# Patient Record
Sex: Male | Born: 1975 | Race: Black or African American | Hispanic: No | Marital: Single | Smoking: Never smoker
Health system: Southern US, Community
[De-identification: ages and names within clinical notes are randomized; demographics above are authoritative.]

## PROBLEM LIST (undated history)

## (undated) DIAGNOSIS — R011 Cardiac murmur, unspecified: Secondary | ICD-10-CM

---

## 2005-10-16 ENCOUNTER — Emergency Department: Payer: Self-pay | Admitting: General Practice

## 2005-10-16 ENCOUNTER — Emergency Department: Payer: Self-pay | Admitting: Emergency Medicine

## 2005-12-02 ENCOUNTER — Emergency Department: Payer: Self-pay | Admitting: General Practice

## 2005-12-02 ENCOUNTER — Other Ambulatory Visit: Payer: Self-pay

## 2005-12-04 ENCOUNTER — Emergency Department: Payer: Self-pay | Admitting: Emergency Medicine

## 2005-12-05 ENCOUNTER — Other Ambulatory Visit: Payer: Self-pay

## 2005-12-06 ENCOUNTER — Emergency Department: Payer: Self-pay | Admitting: Emergency Medicine

## 2005-12-07 ENCOUNTER — Other Ambulatory Visit: Payer: Self-pay

## 2006-08-14 ENCOUNTER — Emergency Department: Payer: Self-pay | Admitting: Unknown Physician Specialty

## 2006-10-03 ENCOUNTER — Emergency Department: Payer: Self-pay | Admitting: Emergency Medicine

## 2006-12-15 ENCOUNTER — Emergency Department (HOSPITAL_COMMUNITY): Admission: EM | Admit: 2006-12-15 | Discharge: 2006-12-15 | Payer: Self-pay | Admitting: Emergency Medicine

## 2007-03-29 ENCOUNTER — Emergency Department: Payer: Self-pay

## 2007-08-15 ENCOUNTER — Emergency Department: Payer: Self-pay | Admitting: Emergency Medicine

## 2007-08-24 ENCOUNTER — Emergency Department: Payer: Self-pay | Admitting: Emergency Medicine

## 2007-08-30 ENCOUNTER — Ambulatory Visit: Payer: Self-pay | Admitting: Gastroenterology

## 2007-09-10 ENCOUNTER — Emergency Department: Payer: Self-pay | Admitting: Emergency Medicine

## 2007-09-14 ENCOUNTER — Emergency Department: Payer: Self-pay | Admitting: Internal Medicine

## 2007-10-24 ENCOUNTER — Emergency Department: Payer: Self-pay | Admitting: Emergency Medicine

## 2007-10-24 ENCOUNTER — Emergency Department (HOSPITAL_COMMUNITY): Admission: EM | Admit: 2007-10-24 | Discharge: 2007-10-24 | Payer: Self-pay | Admitting: Emergency Medicine

## 2009-04-27 ENCOUNTER — Emergency Department (HOSPITAL_COMMUNITY): Admission: EM | Admit: 2009-04-27 | Discharge: 2009-04-27 | Payer: Self-pay | Admitting: Emergency Medicine

## 2009-10-26 ENCOUNTER — Emergency Department: Payer: Self-pay | Admitting: Emergency Medicine

## 2009-11-17 ENCOUNTER — Emergency Department (HOSPITAL_COMMUNITY)
Admission: EM | Admit: 2009-11-17 | Discharge: 2009-11-17 | Payer: Self-pay | Source: Home / Self Care | Admitting: Emergency Medicine

## 2009-11-19 ENCOUNTER — Emergency Department: Payer: Self-pay | Admitting: Emergency Medicine

## 2009-11-27 ENCOUNTER — Emergency Department: Payer: Self-pay | Admitting: Emergency Medicine

## 2010-04-09 ENCOUNTER — Emergency Department (HOSPITAL_COMMUNITY): Admission: EM | Admit: 2010-04-09 | Discharge: 2010-04-09 | Payer: Self-pay | Admitting: Emergency Medicine

## 2010-05-20 ENCOUNTER — Emergency Department: Payer: Self-pay | Admitting: Emergency Medicine

## 2011-05-17 ENCOUNTER — Emergency Department: Payer: Self-pay | Admitting: Emergency Medicine

## 2012-08-29 ENCOUNTER — Emergency Department: Payer: Self-pay | Admitting: Emergency Medicine

## 2012-10-25 ENCOUNTER — Emergency Department: Payer: Self-pay | Admitting: Emergency Medicine

## 2012-10-25 LAB — RAPID INFLUENZA A&B ANTIGENS

## 2014-01-21 ENCOUNTER — Emergency Department: Payer: Self-pay | Admitting: Emergency Medicine

## 2014-09-09 IMAGING — CR DG CHEST 2V
1 series · 2 of 2 positions shown · non-contrast
Comparison: none

REASON FOR EXAM: cough, chest pain
COMMENTS:

[Series 1: w chest pa · 0.14mm/px · 2 of 2 slices shown]
[im 1/2]
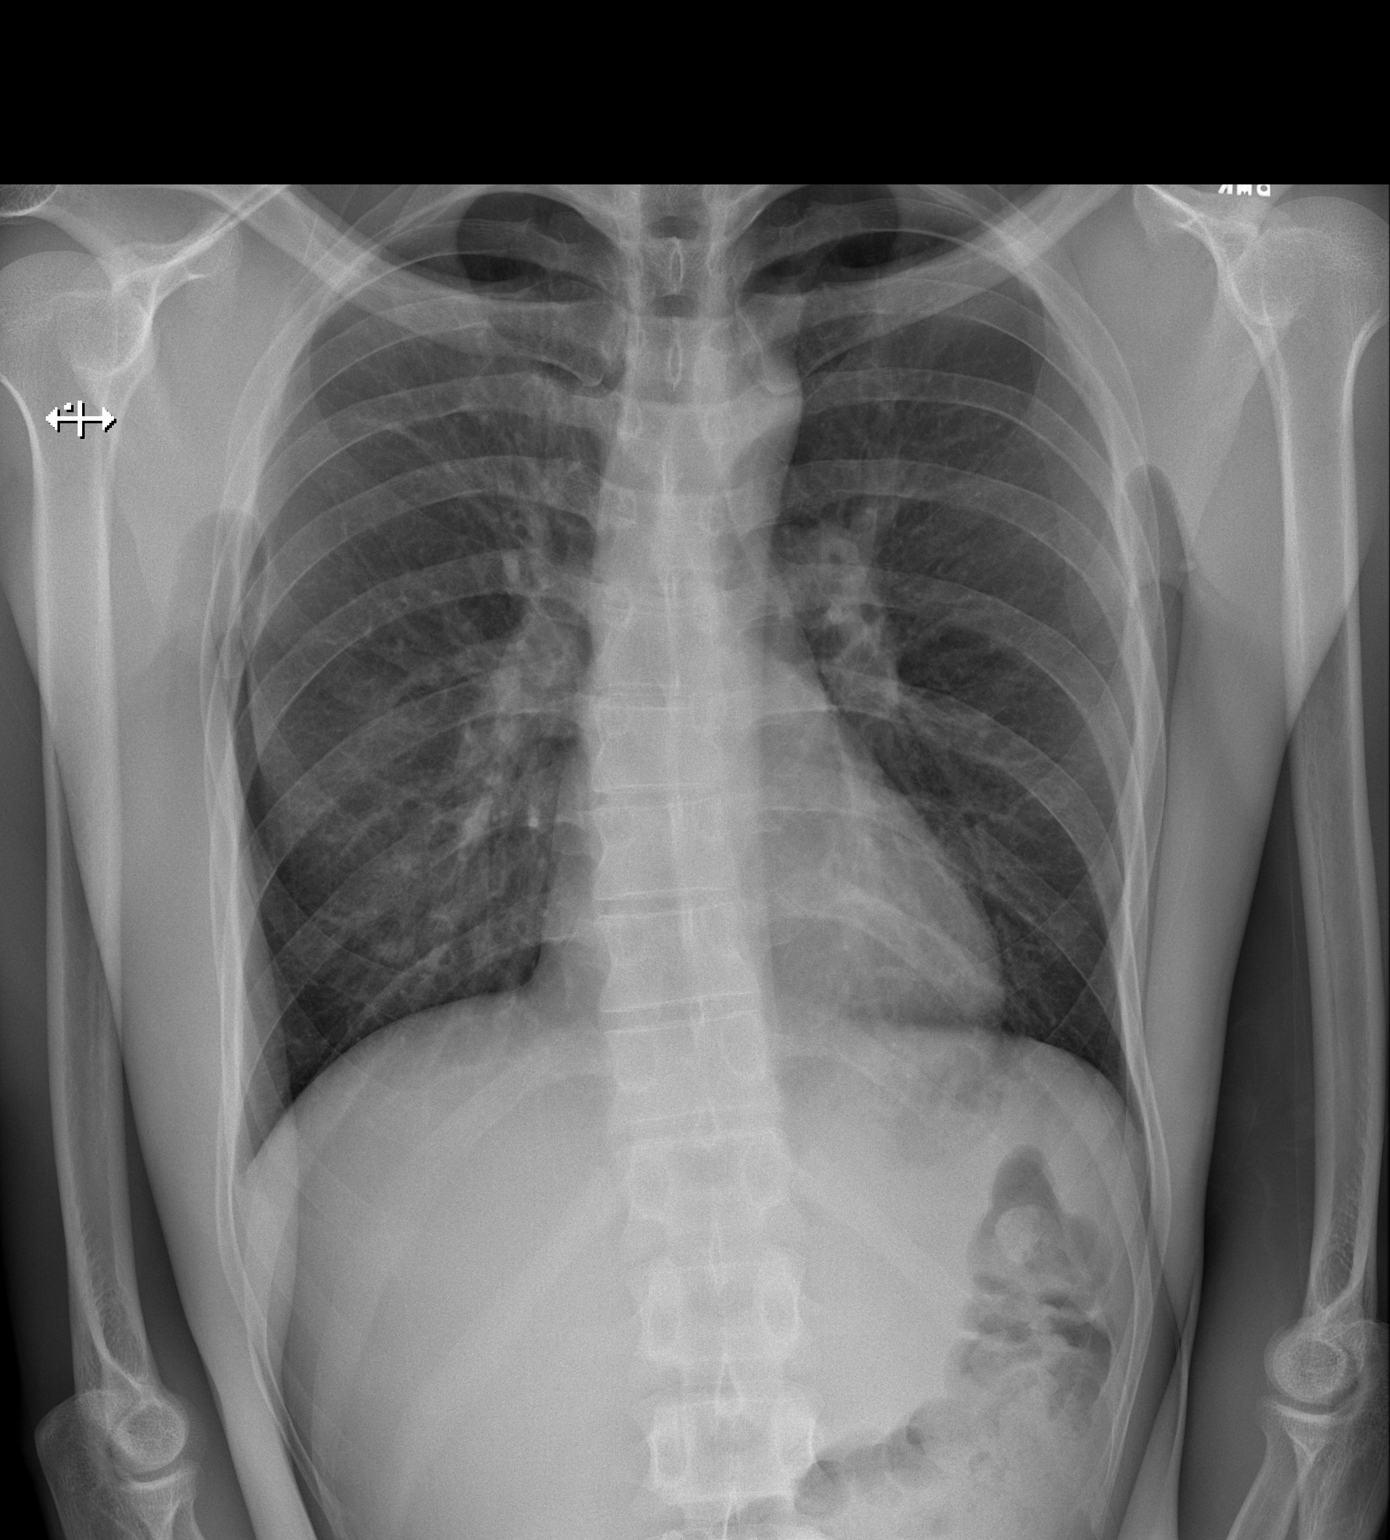
[im 2/2]
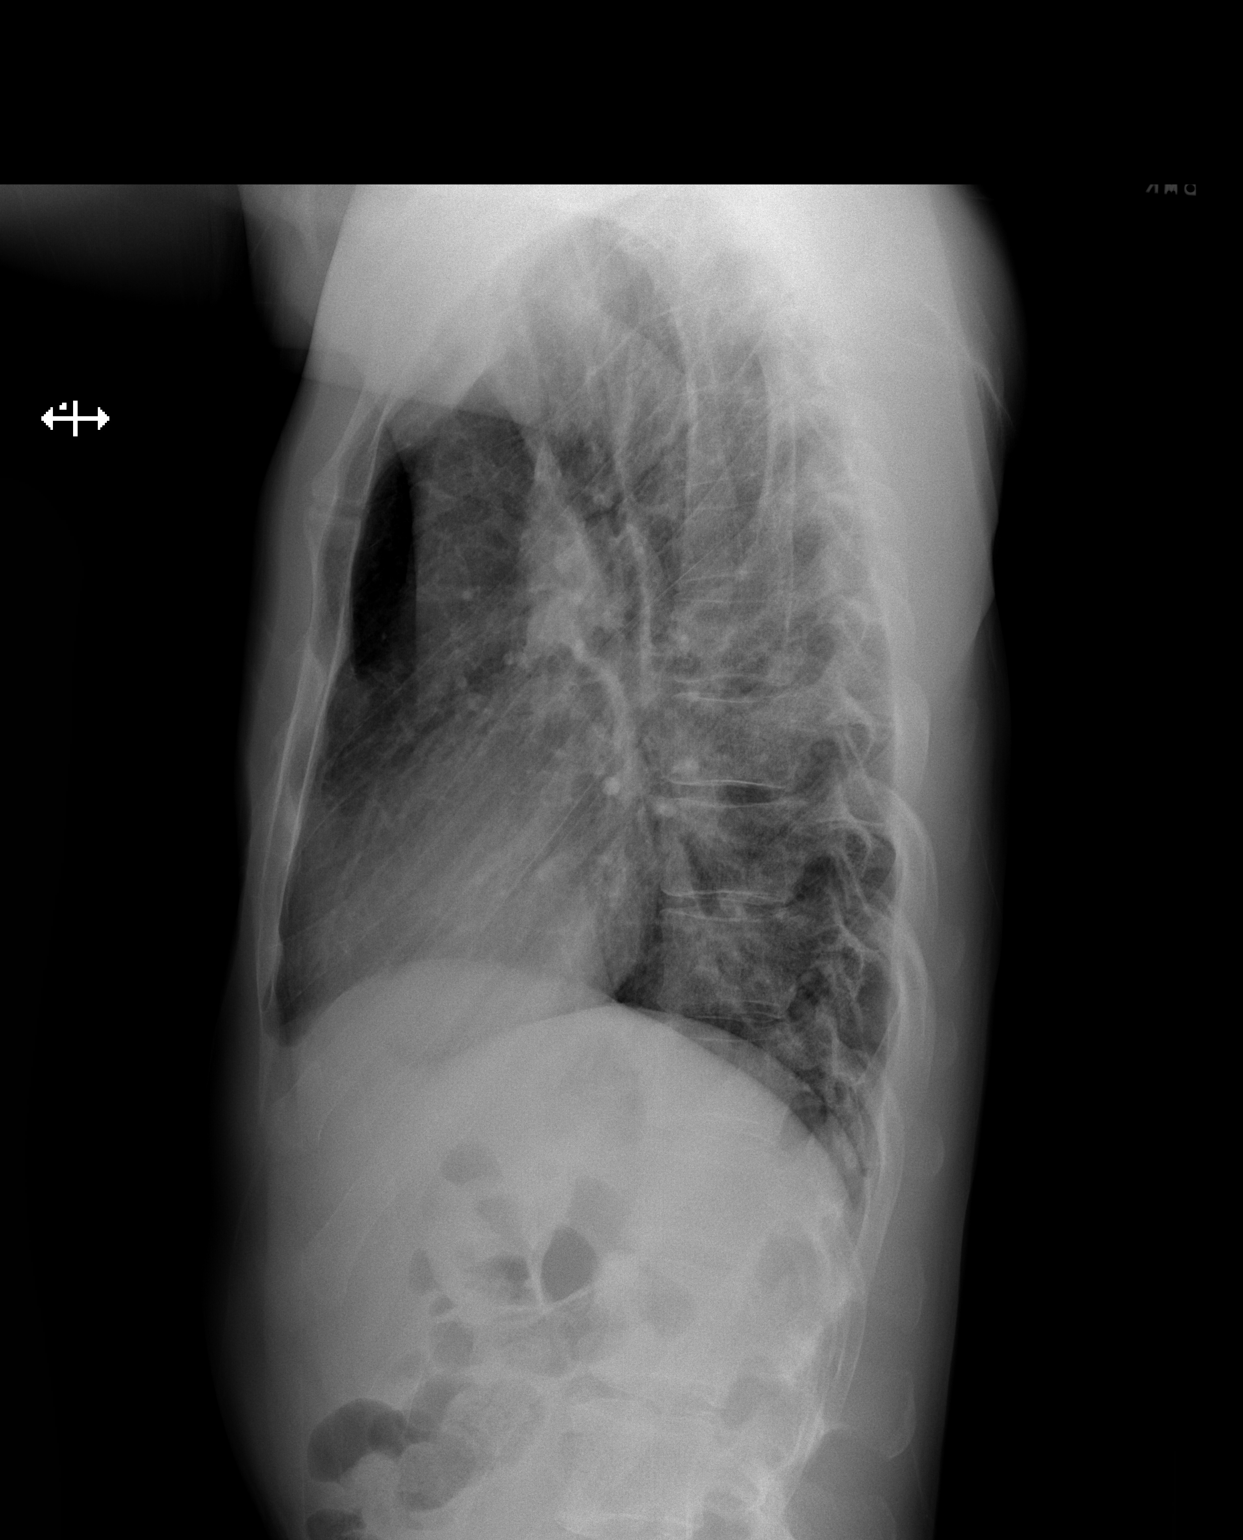

[2 of 2 positions shown; findings below may reference images not displayed]

PROCEDURE:     DXR - DXR CHEST PA (OR AP) AND LATERAL  - October 25, 2012  [DATE]

RESULT:     The lungs are adequately inflated. The interstitial markings are
increased in the perihilar regions especially on the right. There are also
coarse supra and infrahilar lung markings on the right. The cardiac
silhouette is normal in size. The pulmonary vascularity is not engorged. The
mediastinum is normal in width. The gas pattern in the upper abdomen is
normal where visualized. No acute bony abnormality is demonstrated.
IMPRESSION: Increased interstitial densities in both lungs but
especially on the right likely reflects subsegmental atelectasis or early
interstitial pneumonia. Followup films following therapy are recommended to
assure clearing.

[REDACTED]

## 2018-03-27 ENCOUNTER — Other Ambulatory Visit: Payer: Self-pay

## 2018-03-27 ENCOUNTER — Emergency Department
Admission: EM | Admit: 2018-03-27 | Discharge: 2018-03-27 | Disposition: A | Discharge status: Home / Self Care | Payer: Medicare Other | Attending: Student in an Organized Health Care Education/Training Program | Admitting: Student in an Organized Health Care Education/Training Program

## 2018-03-27 ENCOUNTER — Encounter: Payer: Self-pay | Admitting: Emergency Medicine

## 2018-03-27 DIAGNOSIS — H109 Unspecified conjunctivitis: Secondary | ICD-10-CM

## 2018-03-27 DIAGNOSIS — H5713 Ocular pain, bilateral: Secondary | ICD-10-CM | POA: Diagnosis present

## 2018-03-27 HISTORY — DX: Cardiac murmur, unspecified: R01.1

## 2018-03-27 MED ORDER — KETOROLAC TROMETHAMINE 0.5 % OP SOLN: 1.0000 [drp] | Freq: Four times a day (QID) | OPHTHALMIC | 0 refills | Status: DC

## 2018-03-27 MED ORDER — CIPROFLOXACIN HCL 0.3 % OP SOLN: 1.0000 [drp] | OPHTHALMIC | 0 refills | Status: AC

## 2018-03-27 NOTE — ED Notes (Signed)
Called pt no answer °

## 2018-03-27 NOTE — ED Notes (Addendum)
Pt states earlier this week his eyes began to redden, water, and itch. He went to a "24 hour ER" on Monday and they gave him some eye drops that he states have not really been working. Pt is alert and oriented x 4. Right eye is significantly redder than left. Pt does report that he was around someone with pink eye recently.

## 2018-03-27 NOTE — ED Provider Notes (Signed)
[facility] Emergency Department Provider Note  ____________________________________________  Time seen: Approximately 8:42 PM  I have reviewed the triage vital signs and the nursing notes.   HISTORY  Chief Complaint Conjunctivitis    HPI Jose Love is a 42 y.o. male who presents the emergency department complaining of ongoing pinkeye.  Patient reports he was seen at a walk-in clinic and prescribed erythromycin ointment for his conjunctivitis.  Patient's symptoms began in the right eye, then moved to his left eye.  He has had purulent drainage, erythema, itching and "gritty" sensation to the eye.  No trauma.  Patient does not wear glasses or contacts.  Patient denies any other complaints to include URI symptoms, headache, visual changes, loss of vision, neck pain, chest pain, shortness of breath, abdominal pain, nausea vomiting.  Patient has been using the erythromycin ointment for just over 24 hours.  No other medications for this complaint.  Past Medical History:  Diagnosis Date  . Heart murmur     There are no active problems to display for this patient.   History reviewed. No pertinent surgical history.  Prior to Admission medications   Medication Sig Start Date End Date Taking? Authorizing Provider  ciprofloxacin (CILOXAN) 0.3 % ophthalmic solution Place 1 drop into both eyes every 2 (two) hours for 5 days. Administer 1 drop, every 2 hours, while awake, for 2 days. Then 1 drop, every 4 hours, while awake, for the next 5 days. 03/27/18 04/01/18  Tyree Fluharty, Delorise Royals, PA-C  ketorolac (ACULAR) 0.5 % ophthalmic solution Place 1 drop into both eyes 4 (four) times daily. 03/27/18   Dedria Endres, Delorise Royals, PA-C    Allergies Morphine and related and Theraflu cold & [phenylephrine-pheniramine-dm]  No family history on file.  Social History Social History   Tobacco Use  . Smoking status: Never Smoker  . Smokeless tobacco: Never Used  Substance Use  Topics  . Alcohol use: Yes  . Drug use: Not on file     Review of Systems  Constitutional: No fever/chills Eyes: No visual changes.  Bilateral eye irritation, redness, greater on right than left.  Purulent drainage from bilateral eyes. ENT: No upper respiratory complaints. Cardiovascular: no chest pain. Respiratory: no cough. No SOB. Gastrointestinal: No abdominal pain.  No nausea, no vomiting.   Musculoskeletal: Negative for musculoskeletal pain. Skin: Negative for rash, abrasions, lacerations, ecchymosis. Neurological: Negative for headaches, focal weakness or numbness. 10-point ROS otherwise negative.  ____________________________________________   PHYSICAL EXAM:  VITAL SIGNS: ED Triage Vitals [03/27/18 1929]  Enc Vitals Group     BP 122/78     Pulse Rate 69     Resp 20     Temp 98.5 F (36.9 C)     Temp Source Oral     SpO2 99 %     Weight 130 lb (59 kg)     Height  (1.702 m)     Head Circumference      Peak Flow      Pain Score 6     Pain Loc      Pain Edu?      Excl. in GC?      Constitutional: Alert and oriented. Well appearing and in no acute distress. Eyes: Purulent drainage noted to upper and lower eyelashes bilaterally, worse on right than left.  Conjunctiva are erythematous bilaterally, worse on right than left.  Mild subconjunctival hemorrhaging also appreciated on right. Positive red reflex bilaterally Funduscopic exam reveals no foreign body, vasculature and  optic disc is unremarkable.  PERRL. EOMI. Head: Atraumatic. ENT:      Ears:       Nose: No congestion/rhinnorhea.      Mouth/Throat: Mucous membranes are moist.  Neck: No stridor.    Cardiovascular: Normal rate, regular rhythm. Normal S1 and S2.  Good peripheral circulation. Respiratory: Normal respiratory effort without tachypnea or retractions. Lungs CTAB. Good air entry to the bases with no decreased or absent breath sounds. Musculoskeletal: Full range of motion to all extremities. No  gross deformities appreciated. Neurologic:  Normal speech and language. No gross focal neurologic deficits are appreciated.  Skin:  Skin is warm, dry and intact. No rash noted. Psychiatric: Mood and affect are normal. Speech and behavior are normal. Patient exhibits appropriate insight and judgement.   ____________________________________________   LABS (all labs ordered are listed, but only abnormal results are displayed)  Labs Reviewed - No data to display ____________________________________________  EKG   ____________________________________________  RADIOLOGY   No results found.  ____________________________________________    PROCEDURES  Procedure(s) performed:    Procedures    Medications - No data to display   ____________________________________________   INITIAL IMPRESSION / ASSESSMENT AND PLAN / ED COURSE  Pertinent labs & imaging results that were available during my care of the patient were reviewed by me and considered in my medical decision making (see chart for details).  Review of the [place] CSRS was performed in accordance of the [facility] prior to dispensing any controlled drugs.     Patient's diagnosis is consistent with bacterial conjunctivitis.  Patient has a known diagnosis of bacterial conjunctivitis bilaterally.  Symptoms began in the right eye that progressed to the left eye.  Patient was started on erythromycin ointment.  Patient has been applying save for just over 24 hours.  Patient does not feel like medication has effectively improved his symptoms.  All this is short duration, I also suspect that patient has not been using ointment as effectively as he was at antibiotic eyedrops.  Given findings, I will place patient on antibiotic eyedrops and Acular for symptom control.  Patient is encouraged to stop erythromycin ointment and use the ciprofloxacin eyedrops instead.  If symptoms do not improve after 3 to 4 days, patient is to follow-up with  ophthalmology. Patient is given ED precautions to return to the ED for any worsening or new symptoms.     ____________________________________________  FINAL CLINICAL IMPRESSION(S) / ED DIAGNOSES  Final diagnoses:  Bacterial conjunctivitis of both eyes      NEW MEDICATIONS STARTED DURING THIS VISIT:  ED Discharge Orders        Ordered    ciprofloxacin (CILOXAN) 0.3 % ophthalmic solution  Every 2 hours     03/27/18 2050    ketorolac (ACULAR) 0.5 % ophthalmic solution  4 times daily     03/27/18 2050          This chart was dictated using voice recognition software/Dragon. Despite best efforts to proofread, errors can occur which can change the meaning. Any change was purely unintentional.    Racheal Patches, PA-C 03/27/18 2206    Willy Eddy, MD 03/27/18 2224

## 2018-03-27 NOTE — ED Triage Notes (Signed)
Patient to the ER for c/o redness to conjunctiva on both eyes. States he went to ER for the same 2 days ago and was given eye drops while there, as well as prescription for erythromycin drops. States he doesn't feel like symptoms are getting any better. Denies any dried drainage in the am, but states drainage continues to drip from both eyes.

## 2018-06-01 DIAGNOSIS — Y998 Other external cause status: Secondary | ICD-10-CM | POA: Diagnosis not present

## 2018-06-01 DIAGNOSIS — T22212A Burn of second degree of left forearm, initial encounter: Secondary | ICD-10-CM | POA: Diagnosis not present

## 2018-06-01 DIAGNOSIS — Y9389 Activity, other specified: Secondary | ICD-10-CM | POA: Insufficient documentation

## 2018-06-01 DIAGNOSIS — T22012A Burn of unspecified degree of left forearm, initial encounter: Secondary | ICD-10-CM | POA: Diagnosis present

## 2018-06-01 DIAGNOSIS — X58XXXA Exposure to other specified factors, initial encounter: Secondary | ICD-10-CM | POA: Insufficient documentation

## 2018-06-01 DIAGNOSIS — Y929 Unspecified place or not applicable: Secondary | ICD-10-CM | POA: Insufficient documentation

## 2018-06-01 DIAGNOSIS — X19XXXA Contact with other heat and hot substances, initial encounter: Secondary | ICD-10-CM | POA: Insufficient documentation

## 2018-06-02 ENCOUNTER — Emergency Department
Admission: EM | Admit: 2018-06-02 | Discharge: 2018-06-02 | Disposition: A | Discharge status: Home / Self Care | Payer: Medicare Other | Attending: Emergency Medicine | Admitting: Emergency Medicine

## 2018-06-02 DIAGNOSIS — T22212A Burn of second degree of left forearm, initial encounter: Secondary | ICD-10-CM | POA: Diagnosis not present

## 2018-06-02 MED ORDER — BACITRACIN ZINC 500 UNIT/GM EX OINT: TOPICAL_OINTMENT | CUTANEOUS | 0 refills | Status: AC

## 2018-06-02 MED ORDER — BACITRACIN ZINC 500 UNIT/GM EX OINT
TOPICAL_OINTMENT | Freq: Once | CUTANEOUS | Status: AC
  Administered 2018-06-02: 1 via TOPICAL
  Filled 2018-06-02: qty 5.4
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
## 2020-10-15 ENCOUNTER — Other Ambulatory Visit: Payer: Self-pay

## 2020-10-15 ENCOUNTER — Encounter: Payer: Self-pay | Admitting: Cardiology

## 2020-10-15 ENCOUNTER — Ambulatory Visit (INDEPENDENT_AMBULATORY_CARE_PROVIDER_SITE_OTHER): Payer: Medicare Other | Admitting: Cardiology

## 2020-10-15 VITALS — BP 100/80 | HR 63 | Ht 67.0 in | Wt 126.0 lb

## 2020-10-15 DIAGNOSIS — R011 Cardiac murmur, unspecified: Secondary | ICD-10-CM

## 2020-10-15 DIAGNOSIS — K30 Functional dyspepsia: Secondary | ICD-10-CM | POA: Diagnosis not present

## 2020-10-15 NOTE — Progress Notes (Signed)
Cardiology Office Note:    Date:  10/15/2020   ID:  Jose Love, DOB 05-19-1976, MRN [id number]  PCP:  Patient, No Pcp Per  [facility] Cardiologist:  Debbe Odea, MD  [facility] Electrophysiologist:  None   Referring MD: No ref. provider found   Chief Complaint  Patient presents with  . Other    NEW patient-Cardiac evaluation. Patient states "my stomach does not feel right" and he feels nauseous. He reports hx of gut issues at which time he states required a Z-pak for tx for stomach bacteria. Hx of heart murmur    History of Present Illness:    Jose Love is a 44 y.o. male with a hx of cardiac murmur who presents due to abdominal discomfort.  Patient states having upset stomach for some time now, and was hoping to see a stomach doctor today.  Denies chest pain or shortness of breath.  States eating certain foods make his stomach worse.  He states he has had imaging in the past for his heart, and everything is okay, would not want any cardiac work-up at this time.  He is concerned about his stomach and thought his appointment today was to see a stomach doctor.  He has had a Z-Pak in the past for treatment.  Denies any cardiac symptoms of chest pain or shortness of breath.  Past Medical History:  Diagnosis Date  . Heart murmur     History reviewed. No pertinent surgical history.  Current Medications: No outpatient medications have been marked as taking for the 10/15/20 encounter (Office Visit) with Debbe Odea, MD.     Allergies:   Morphine and related and Theraflu cold & [phenylephrine-pheniramine-dm]   Social History   Socioeconomic History  . Marital status: Single    Spouse name: Not on file  . Number of children: Not on file  . Years of education: Not on file  . Highest education level: Not on file  Occupational History  . Not on file  Tobacco Use  . Smoking status: Never Smoker  . Smokeless tobacco: Never Used  Vaping Use  . Vaping  Use: Never used  Substance and Sexual Activity  . Alcohol use: Yes    Comment: occassionally  . Drug use: Never  . Sexual activity: Not on file  Other Topics Concern  . Not on file  Social History Narrative  . Not on file   Social Determinants of Health   Financial Resource Strain: Not on file  Food Insecurity: Not on file  Transportation Needs: Not on file  Physical Activity: Not on file  Stress: Not on file  Social Connections: Not on file     Family History: The patient's family history is not on file.  ROS:   Please see the history of present illness.     All other systems reviewed and are negative.  EKGs/Labs/Other Studies Reviewed:    The following studies were reviewed today:   EKG:  EKG is  ordered today.  The ekg ordered today demonstrates normal sinus rhythm,  Recent Labs: No results found for requested labs within last 8760 hours.  Recent Lipid Panel No results found for: CHOL, TRIG, HDL, CHOLHDL, VLDL, LDLCALC, LDLDIRECT   Risk Assessment/Calculations:      Physical Exam:    VS:  BP 100/80 (BP Location: Right Arm, Patient Position: Sitting, Cuff Size: Normal)   Pulse 63   Ht 5\' 7"  (1.702 m)   Wt 126 lb (57.2 kg)  SpO2 97%   BMI 19.73 kg/m     Wt Readings from Last 3 Encounters:  10/15/20 126 lb (57.2 kg)  06/02/18 130 lb (59 kg)  03/27/18 130 lb (59 kg)     GEN:  Well nourished, well developed in no acute distress HEENT: Normal NECK: No JVD; No carotid bruits LYMPHATICS: No lymphadenopathy CARDIAC: RRR, 2/6 systolic murmur RESPIRATORY:  Clear to auscultation without rales, wheezing or rhonchi  ABDOMEN: Soft, non-tender, non-distended MUSCULOSKELETAL:  No edema; No deformity  SKIN: Warm and dry NEUROLOGIC:  Alert and oriented x 3 PSYCHIATRIC:  Normal affect   ASSESSMENT:    1. Murmur   2. Upset stomach    PLAN:    In order of problems listed above:  1. Patient with systolic murmur noted on exam.  States having these his  entire life.  Had cardiac imaging in the past, although I could not find in the chart.  States everything is fine regarding his heart/murmur, declines cardiac work-up for this time.  Patient states he will want to focus on his stomach at this point. 2. GI upset, patient educated and told he will need a GI specialist and not a cardiologist regarding abdominal discomfort, gas, nausea.  Follow-up as needed    Shared Decision Making/Informed Consent       Medication Adjustments/Labs and Tests Ordered: Current medicines are reviewed at length with the patient today.  Concerns regarding medicines are outlined above.  Orders Placed This Encounter  Procedures  . EKG 12-Lead   No orders of the defined types were placed in this encounter.   Patient Instructions  Medication Instructions:  Your physician recommends that you continue on your current medications as directed. Please refer to the Current Medication list given to you today.  *If you need a refill on your cardiac medications before your next appointment, please call your pharmacy*  Follow-Up: At [facility], you and your health needs are our priority.  As part of our continuing mission to provide you with exceptional heart care, we have created designated Provider Care Teams.  These Care Teams include your primary Cardiologist (physician) and Advanced Practice Providers (APPs -  Physician Assistants and Nurse Practitioners) who all work together to provide you with the care you need, when you need it.  We recommend signing up for the patient portal called "MyChart".  Sign up information is provided on this After Visit Summary.  MyChart is used to connect with patients for Virtual Visits (Telemedicine).  Patients are able to view lab/test results, encounter notes, upcoming appointments, etc.  Non-urgent messages can be sent to your provider as well.   To learn more about what you can do with MyChart, go to [email or web address].     Your next appointment:   As needed.   The format for your next appointment:   In Person  Provider:   You may see Debbe Odea, MD or one of the following Advanced Practice Providers on your designated Care Team:    Nicolasa Ducking, NP  Eula Listen, PA-C  Marisue Ivan, PA-C  Cadence Fransico Michael, [place]  Gillian Shields, NP    Other Instructions Please follow up with a Primary Care Doctor or Gastrointestinal doctor. Call [facility] at [phone] to assist you with getting an appointment.   [facility] [place] [address] [place],  [place]  [address] Get Driving Directions Main: [phone]      Signed, Debbe Odea, MD  10/15/2020 12:55 PM  [facility]

## 2020-10-15 NOTE — Patient Instructions (Signed)
Medication Instructions:  Your physician recommends that you continue on your current medications as directed. Please refer to the Current Medication list given to you today.  *If you need a refill on your cardiac medications before your next appointment, please call your pharmacy*  Follow-Up: At [facility], you and your health needs are our priority.  As part of our continuing mission to provide you with exceptional heart care, we have created designated Provider Care Teams.  These Care Teams include your primary Cardiologist (physician) and Advanced Practice Providers (APPs -  Physician Assistants and Nurse Practitioners) who all work together to provide you with the care you need, when you need it.  We recommend signing up for the patient portal called "MyChart".  Sign up information is provided on this After Visit Summary.  MyChart is used to connect with patients for Virtual Visits (Telemedicine).  Patients are able to view lab/test results, encounter notes, upcoming appointments, etc.  Non-urgent messages can be sent to your provider as well.   To learn more about what you can do with MyChart, go to [email or web address].    Your next appointment:   As needed.   The format for your next appointment:   In Person  Provider:   You may see Debbe Odea, MD or one of the following Advanced Practice Providers on your designated Care Team:    Nicolasa Ducking, NP  Eula Listen, PA-C  Marisue Ivan, PA-C  Cadence Fransico Michael, [place]  Gillian Shields, NP    Other Instructions Please follow up with a Primary Care Doctor or Gastrointestinal doctor. Call [facility] at [phone] to assist you with getting an appointment.   [facility] [place] [address] [place],  [place]  [address] Get Driving Directions Main: [phone]

## 2023-11-09 DIAGNOSIS — J029 Acute pharyngitis, unspecified: Secondary | ICD-10-CM | POA: Diagnosis not present

## 2023-11-15 DIAGNOSIS — J069 Acute upper respiratory infection, unspecified: Secondary | ICD-10-CM | POA: Diagnosis not present

## 2024-02-24 DIAGNOSIS — N342 Other urethritis: Secondary | ICD-10-CM | POA: Diagnosis not present

## 2024-03-25 DIAGNOSIS — F17291 Nicotine dependence, other tobacco product, in remission: Secondary | ICD-10-CM | POA: Diagnosis not present

## 2024-03-25 DIAGNOSIS — Z008 Encounter for other general examination: Secondary | ICD-10-CM | POA: Diagnosis not present

## 2024-03-25 DIAGNOSIS — J309 Allergic rhinitis, unspecified: Secondary | ICD-10-CM | POA: Diagnosis not present
